# Patient Record
Sex: Female | Born: 1945 | Race: White | Hispanic: No | Marital: Married | Smoking: Never smoker
Health system: Southern US, Community
[De-identification: ages and names within clinical notes are randomized; demographics above are authoritative.]

## PROBLEM LIST (undated history)

## (undated) DIAGNOSIS — C801 Malignant (primary) neoplasm, unspecified: Secondary | ICD-10-CM

## (undated) DIAGNOSIS — I10 Essential (primary) hypertension: Secondary | ICD-10-CM

## (undated) DIAGNOSIS — I639 Cerebral infarction, unspecified: Secondary | ICD-10-CM

## (undated) HISTORY — PX: ABDOMINAL HYSTERECTOMY: SHX81

## (undated) HISTORY — PX: CHOLECYSTECTOMY: SHX55

## (undated) HISTORY — PX: TONSILLECTOMY: SUR1361

---

## 2004-06-21 ENCOUNTER — Ambulatory Visit: Payer: Self-pay | Admitting: Obstetrics and Gynecology

## 2009-07-07 ENCOUNTER — Ambulatory Visit: Payer: Self-pay | Admitting: Cardiology

## 2009-09-22 ENCOUNTER — Ambulatory Visit: Payer: Self-pay | Admitting: Gastroenterology

## 2009-11-01 ENCOUNTER — Ambulatory Visit: Payer: Self-pay | Admitting: Unknown Physician Specialty

## 2010-08-24 ENCOUNTER — Ambulatory Visit: Payer: Self-pay | Admitting: Cardiology

## 2010-11-15 ENCOUNTER — Ambulatory Visit: Payer: Self-pay | Admitting: Unknown Physician Specialty

## 2012-01-05 ENCOUNTER — Ambulatory Visit: Payer: Self-pay | Admitting: Internal Medicine

## 2013-01-17 ENCOUNTER — Ambulatory Visit: Payer: Self-pay | Admitting: Family Medicine

## 2013-12-27 ENCOUNTER — Ambulatory Visit: Payer: Self-pay | Admitting: Family Medicine

## 2013-12-27 LAB — LIPASE, BLOOD: LIPASE: 104 U/L (ref 73–393)

## 2013-12-27 LAB — COMPREHENSIVE METABOLIC PANEL
ALT: 30 U/L (ref 12–78)
ANION GAP: 9 (ref 7–16)
Albumin: 3.9 g/dL (ref 3.4–5.0)
Alkaline Phosphatase: 76 U/L
BILIRUBIN TOTAL: 0.5 mg/dL (ref 0.2–1.0)
BUN: 16 mg/dL (ref 7–18)
CREATININE: 1.1 mg/dL (ref 0.60–1.30)
Calcium, Total: 9.3 mg/dL (ref 8.5–10.1)
Chloride: 101 mmol/L (ref 98–107)
Co2: 30 mmol/L (ref 21–32)
EGFR (African American): >60
GFR CALC NON AF AMER: 52 — AB
GLUCOSE: 109 mg/dL — AB (ref 65–99)
OSMOLALITY: 281 (ref 275–301)
Potassium: 3.8 mmol/L (ref 3.5–5.1)
SGOT(AST): 25 U/L (ref 15–37)
SODIUM: 140 mmol/L (ref 136–145)
Total Protein: 8 g/dL (ref 6.4–8.2)

## 2013-12-27 LAB — CBC WITH DIFFERENTIAL/PLATELET
BASOS ABS: 0 10*3/uL (ref 0.0–0.1)
Basophil %: 0.3 %
EOS ABS: 0 10*3/uL (ref 0.0–0.7)
Eosinophil %: 0.4 %
HCT: 38.6 % (ref 35.0–47.0)
HGB: 12.7 g/dL (ref 12.0–16.0)
Lymphocyte #: 0.7 10*3/uL — ABNORMAL LOW (ref 1.0–3.6)
Lymphocyte %: 19.7 %
MCH: 27.7 pg (ref 26.0–34.0)
MCHC: 32.9 g/dL (ref 32.0–36.0)
MCV: 84 fL (ref 80–100)
Monocyte #: 0.2 x10 3/mm (ref 0.2–0.9)
Monocyte %: 4.8 %
Neutrophil #: 2.6 10*3/uL (ref 1.4–6.5)
Neutrophil %: 74.8 %
Platelet: 162 10*3/uL (ref 150–440)
RBC: 4.6 10*6/uL (ref 3.80–5.20)
RDW: 14.8 % — ABNORMAL HIGH (ref 11.5–14.5)
WBC: 3.4 10*3/uL — ABNORMAL LOW (ref 3.6–11.0)

## 2014-02-26 ENCOUNTER — Ambulatory Visit: Payer: Self-pay | Admitting: Physician Assistant

## 2014-02-27 ENCOUNTER — Ambulatory Visit: Payer: Self-pay | Admitting: Gastroenterology

## 2014-03-05 LAB — PATHOLOGY REPORT

## 2014-10-01 ENCOUNTER — Ambulatory Visit
Admit: 2014-10-01 | Disposition: A | Discharge status: Home / Self Care | Payer: Self-pay | Attending: Family Medicine | Admitting: Family Medicine

## 2014-10-16 ENCOUNTER — Ambulatory Visit: Admit: 2014-10-16 | Disposition: A | Discharge status: Home / Self Care | Payer: Self-pay | Admitting: Family Medicine

## 2014-11-19 ENCOUNTER — Encounter: Payer: Self-pay | Admitting: *Deleted

## 2014-11-19 ENCOUNTER — Encounter
Admission: RE | Disposition: A | Discharge status: Home / Self Care | Payer: Self-pay | Source: Ambulatory Visit | Attending: Gastroenterology

## 2014-11-19 ENCOUNTER — Ambulatory Visit
Admission: RE | Admit: 2014-11-19 | Discharge: 2014-11-19 | Disposition: A | Discharge status: Home / Self Care | Payer: Medicare Other | Source: Ambulatory Visit | Attending: Gastroenterology | Admitting: Gastroenterology

## 2014-11-19 ENCOUNTER — Ambulatory Visit: Payer: Medicare Other | Admitting: Certified Registered Nurse Anesthetist

## 2014-11-19 DIAGNOSIS — Z86010 Personal history of colonic polyps: Secondary | ICD-10-CM | POA: Diagnosis not present

## 2014-11-19 DIAGNOSIS — Z8371 Family history of colonic polyps: Secondary | ICD-10-CM | POA: Diagnosis not present

## 2014-11-19 DIAGNOSIS — Z7982 Long term (current) use of aspirin: Secondary | ICD-10-CM | POA: Diagnosis not present

## 2014-11-19 DIAGNOSIS — Z7951 Long term (current) use of inhaled steroids: Secondary | ICD-10-CM | POA: Diagnosis not present

## 2014-11-19 DIAGNOSIS — Z1211 Encounter for screening for malignant neoplasm of colon: Secondary | ICD-10-CM | POA: Insufficient documentation

## 2014-11-19 HISTORY — DX: Essential (primary) hypertension: I10

## 2014-11-19 HISTORY — DX: Cerebral infarction, unspecified: I63.9

## 2014-11-19 HISTORY — PX: COLONOSCOPY: SHX5424

## 2014-11-19 SURGERY — COLONOSCOPY
Anesthesia: General

## 2014-11-19 MED ORDER — SODIUM CHLORIDE 0.9 % IV SOLN
INTRAVENOUS | Status: DC
  Administered 2014-11-19: 1000 mL via INTRAVENOUS

## 2014-11-19 MED ORDER — LIDOCAINE HCL (CARDIAC) 20 MG/ML IV SOLN
INTRAVENOUS | Status: DC | PRN
  Administered 2014-11-19: 20 mg via INTRAVENOUS

## 2014-11-19 MED ORDER — PROPOFOL INFUSION 10 MG/ML OPTIME
INTRAVENOUS | Status: DC | PRN
  Administered 2014-11-19: 160 ug/kg/min via INTRAVENOUS

## 2014-11-19 MED ORDER — PROPOFOL 10 MG/ML IV BOLUS
INTRAVENOUS | Status: DC | PRN
  Administered 2014-11-19: 50 mg via INTRAVENOUS

## 2014-11-19 NOTE — Anesthesia Postprocedure Evaluation (Signed)
  Anesthesia Post-op Note  Patient: Julie Werner  Procedure(s) Performed: Procedure(s): COLONOSCOPY (N/A)  Anesthesia type:General  Patient location: PACU  Post pain: Pain level controlled  Post assessment: Post-op Vital signs reviewed, Patient's Cardiovascular Status Stable, Respiratory Function Stable, Patent Airway and No signs of Nausea or vomiting  Post vital signs: Reviewed and stable  Last Vitals:  Filed Vitals:   11/19/14 0940  BP: 114/65  Pulse: 67  Temp:   Resp: 25    Level of consciousness: awake, alert  and patient cooperative  Complications: No apparent anesthesia complications

## 2014-11-19 NOTE — Transfer of Care (Signed)
Immediate Anesthesia Transfer of Care Note  Patient: Julie Werner  Procedure(s) Performed: Procedure(s): COLONOSCOPY (N/A)  Patient Location: Endoscopy Unit  Anesthesia Type:General  Level of Consciousness: awake, alert  and oriented  Airway & Oxygen Therapy: Patient Spontanous Breathing and Patient connected to nasal cannula oxygen  Post-op Assessment: Report given to RN and Post -op Vital signs reviewed and stable  Post vital signs: Reviewed and stable  Last Vitals:  Filed Vitals:   11/19/14 0921  BP: 110/65  Pulse: 77  Temp: 35.7 C  Resp: 16    Complications: No apparent anesthesia complications

## 2014-11-19 NOTE — Anesthesia Preprocedure Evaluation (Addendum)
Anesthesia Evaluation  Patient identified by MRN, date of birth, ID band Patient awake    Reviewed: Allergy & Precautions, NPO status , Patient's Chart, lab work & pertinent test results, reviewed documented beta blocker date and time   Airway Mallampati: II  TM Distance: >3 FB Neck ROM: Full    Dental no notable dental hx.    Pulmonary neg pulmonary ROS,  breath sounds clear to auscultation  Pulmonary exam normal       Cardiovascular hypertension, Pt. on medications and Pt. on home beta blockers + CAD Normal cardiovascular examRhythm:Regular Rate:Normal     Neuro/Psych TIAnegative psych ROS   GI/Hepatic Neg liver ROS, GERD-  Medicated and Controlled,  Endo/Other  negative endocrine ROS  Renal/GU negative Renal ROS  negative genitourinary   Musculoskeletal negative musculoskeletal ROS (+)   Abdominal   Peds negative pediatric ROS (+)  Hematology negative hematology ROS (+)   Anesthesia Other Findings   Reproductive/Obstetrics negative OB ROS                            Anesthesia Physical Anesthesia Plan  ASA: III  Anesthesia Plan: General   Post-op Pain Management:    Induction: Intravenous  Airway Management Planned: Nasal Cannula  Additional Equipment:   Intra-op Plan:   Post-operative Plan:   Informed Consent: I have reviewed the patients History and Physical, chart, labs and discussed the procedure including the risks, benefits and alternatives for the proposed anesthesia with the patient or authorized representative who has indicated his/her understanding and acceptance.     Plan Discussed with: CRNA and Surgeon  Anesthesia Plan Comments:         Anesthesia Quick Evaluation

## 2014-11-19 NOTE — H&P (Signed)
    Primary Care Physician:  Sharyne Peach, MD Primary Gastroenterologist:  Dr. Candace Cruise  Pre-Procedure History & Physical: HPI:  Julie Werner is a 69 y.o. female is here for a colonoscopy.   No past medical history on file.  No past surgical history on file.  Prior to Admission medications   Medication Sig Start Date End Date Taking? Authorizing Provider  aspirin EC 81 MG tablet Take 81 mg by mouth daily.   Yes Historical Provider, MD  calcium carbonate (OS-CAL) 600 MG TABS tablet Take 600 mg by mouth 2 (two) times daily with a meal.   Yes Historical Provider, MD  docusate sodium (COLACE) 100 MG capsule Take 100 mg by mouth 2 (two) times daily.   Yes Historical Provider, MD  fluticasone (FLONASE) 50 MCG/ACT nasal spray Place 1 spray into both nostrils daily.   Yes Historical Provider, MD  metoprolol succinate (TOPROL-XL) 50 MG 24 hr tablet Take 50 mg by mouth daily. Take with or immediately following a meal.   Yes Historical Provider, MD  omeprazole (PRILOSEC) 20 MG capsule Take 20 mg by mouth daily.   Yes Historical Provider, MD  simvastatin (ZOCOR) 40 MG tablet Take 40 mg by mouth daily.   Yes Historical Provider, MD  triamterene-hydrochlorothiazide (DYAZIDE) 37.5-25 MG per capsule Take 1 capsule by mouth daily.   Yes Historical Provider, MD  vitamin C (ASCORBIC ACID) 500 MG tablet Take 500 mg by mouth daily.   Yes Historical Provider, MD    Allergies as of 10/07/2014  . (Not on File)    No family history on file.  History   Social History  . Marital Status: Married    Spouse Name: N/A  . Number of Children: N/A  . Years of Education: N/A   Occupational History  . Not on file.   Social History Main Topics  . Smoking status: Not on file  . Smokeless tobacco: Not on file  . Alcohol Use: Not on file  . Drug Use: Not on file  . Sexual Activity: Not on file   Other Topics Concern  . Not on file   Social History Narrative  . No narrative on file    Review of  Systems: See HPI, otherwise negative ROS  Physical Exam: There were no vitals taken for this visit. General:   Alert,  pleasant and cooperative in NAD Head:  Normocephalic and atraumatic. Neck:  Supple; no masses or thyromegaly. Lungs:  Clear throughout to auscultation.    Heart:  Regular rate and rhythm. Abdomen:  Soft, nontender and nondistended. Normal bowel sounds, without guarding, and without rebound.   Neurologic:  Alert and  oriented x4;  grossly normal neurologically.  Impression/Plan: Julie Werner is here for a colonoscopyto be performed for personal hx of colon polyps and family hx of colon polyps.  Risks, benefits, limitations, and alternatives regarding colonoscopy have been reviewed with the patient.  Questions have been answered.  All parties agreeable.   Blayne Garlick, Lupita Dawn, MD  11/19/2014, 8:29 AM

## 2014-11-19 NOTE — Op Note (Signed)
[facility] Gastroenterology Patient Name: Julie Werner Procedure Date: 11/19/2014 8:57 AM MRN: [id number] Account #: [id number] Date of Birth: Jul 03, 1945 Admit Type: Outpatient Age: 69 Room: [facility] ENDO ROOM 4 Gender: Female Note Status: Finalized Procedure:         Colonoscopy Indications:       Family history of colonic polyps in a first-degree                     relative, Personal history of colonic polyps Providers:         Lupita Dawn. Candace Cruise, MD Referring MD:      Rubbie Battiest Iona Beard, MD (Referring MD) Medicines:         Monitored Anesthesia Care Complications:     No immediate complications. Procedure:         Pre-Anesthesia Assessment:                    - Prior to the procedure, a History and Physical was                     performed, and patient medications, allergies and                     sensitivities were reviewed. The patient's tolerance of                     previous anesthesia was reviewed.                    - The risks and benefits of the procedure and the sedation                     options and risks were discussed with the patient. All                     questions were answered and informed consent was obtained.                    - After reviewing the risks and benefits, the patient was                     deemed in satisfactory condition to undergo the procedure.                    After obtaining informed consent, the colonoscope was                     passed under direct vision. Throughout the procedure, the                     patient's blood pressure, pulse, and oxygen saturations                     were monitored continuously. The Colonoscope was                     introduced through the anus and advanced to the the cecum,                     identified by appendiceal orifice and ileocecal valve. The                     colonoscopy was performed without difficulty. The patient  tolerated the procedure well. The quality of  the bowel                     preparation was good. Findings:      The colon (entire examined portion) appeared normal. Impression:        - The entire examined colon is normal.                    - No specimens collected. Recommendation:    - Discharge patient to home.                    - Repeat colonoscopy in 5 years for surveillance.                    - The findings and recommendations were discussed with the                     patient. Procedure Code(s): --- Professional ---                    [phone], Colonoscopy, flexible; diagnostic, including                     collection of specimen(s) by brushing or washing, when                     performed (separate procedure) Diagnosis Code(s): --- Professional ---                    Z83.71, Family history of colonic polyps                    Z86.010, Personal history of colonic polyps CPT copyright 2014 [employer]. All rights reserved. The codes documented in this report are preliminary and upon coder review may  be revised to meet current compliance requirements. Hulen Luster, MD 11/19/2014 9:18:04 AM This report has been signed electronically. Number of Addenda: 0 Note Initiated On: 11/19/2014 8:57 AM Scope Withdrawal Time: 0 hours 5 minutes 36 seconds  Total Procedure Duration: 0 hours 14 minutes 11 seconds       [facility]

## 2014-11-20 ENCOUNTER — Encounter: Payer: Self-pay | Admitting: Gastroenterology

## 2014-11-20 NOTE — Progress Notes (Signed)
Patient has had a fever since returning home after eating.  Yesterday it was 103.  Last night 101.  Has not checked it this morning.  Encouragedto check temp this morning and to call physician if it is still elevated.

## 2016-06-29 IMAGING — MG MM DIGITAL SCREENING BILAT W/ CAD
5 series · 8 of 8 positions shown · non-contrast
Comparison: Previous exam(s).

CLINICAL DATA: Screening.

EXAM:
DIGITAL SCREENING BILATERAL MAMMOGRAM WITH CAD

[R CC · right · 4 of 4 slices shown (1 of 2)]
[im 1/4]
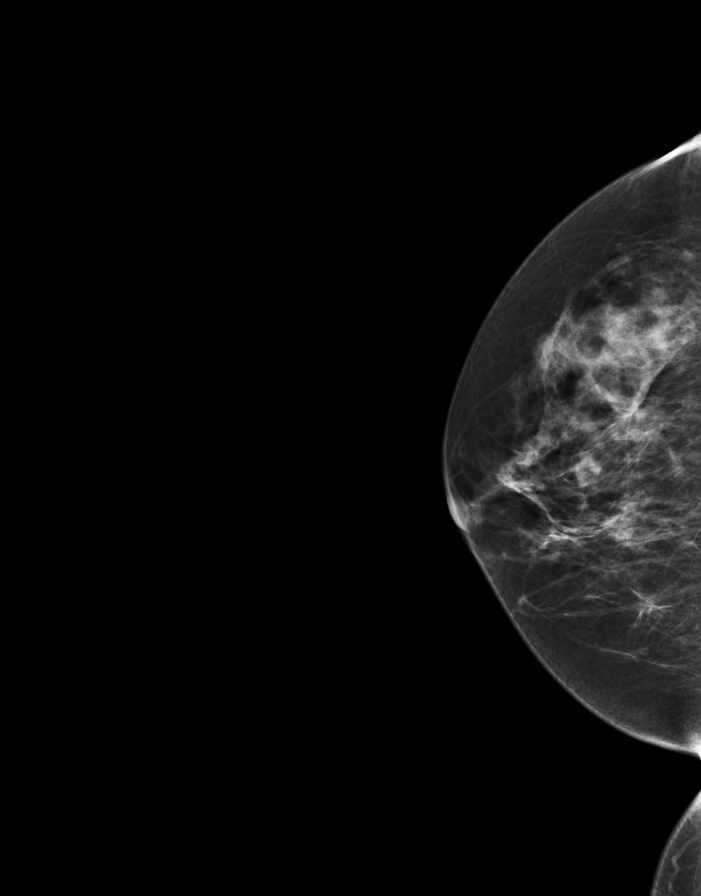
[im 2/4]
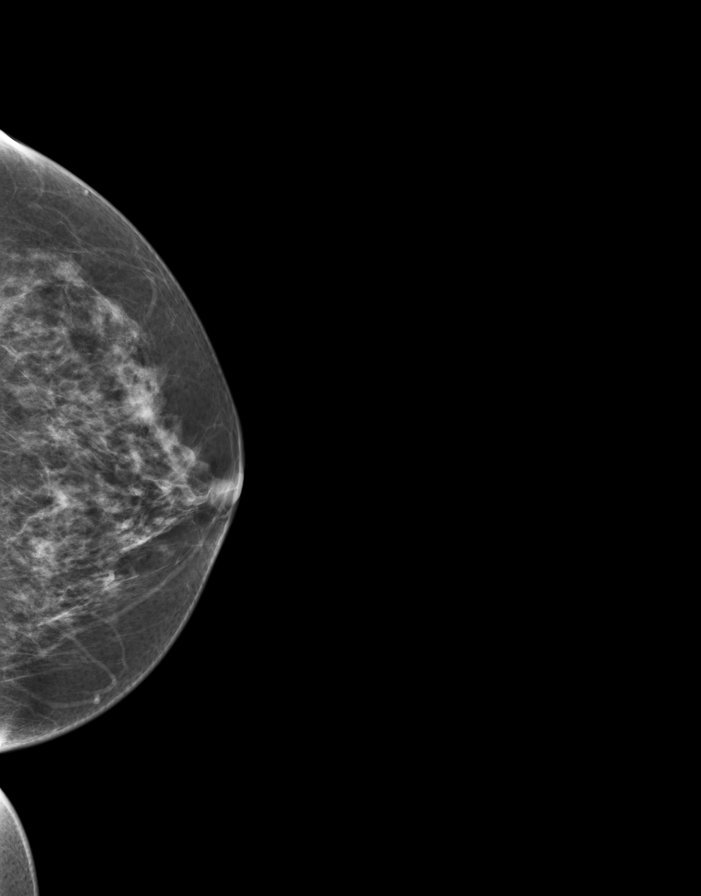
[im 3/4]
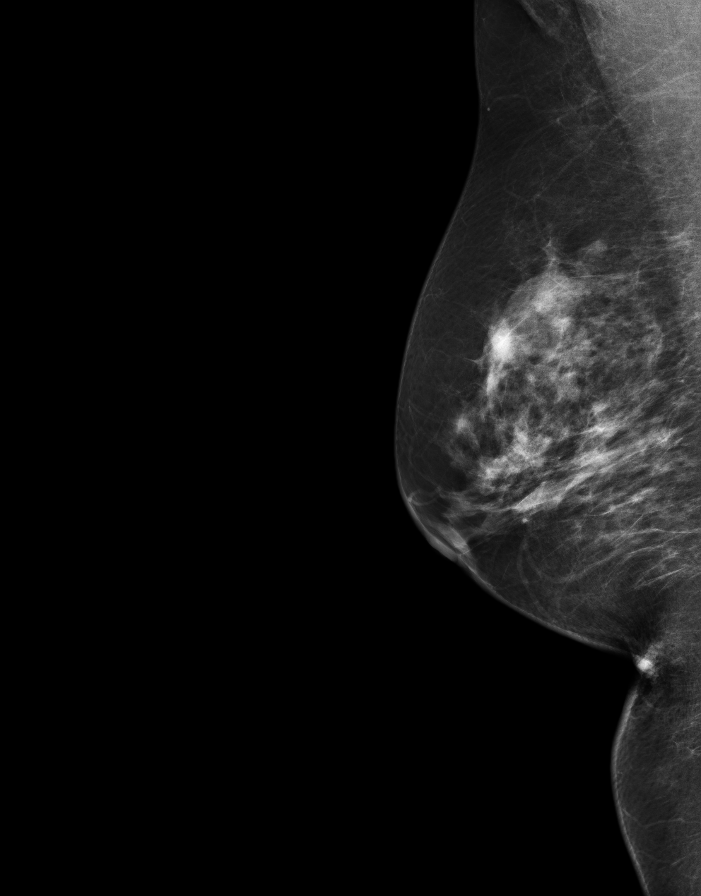
[im 4/4]
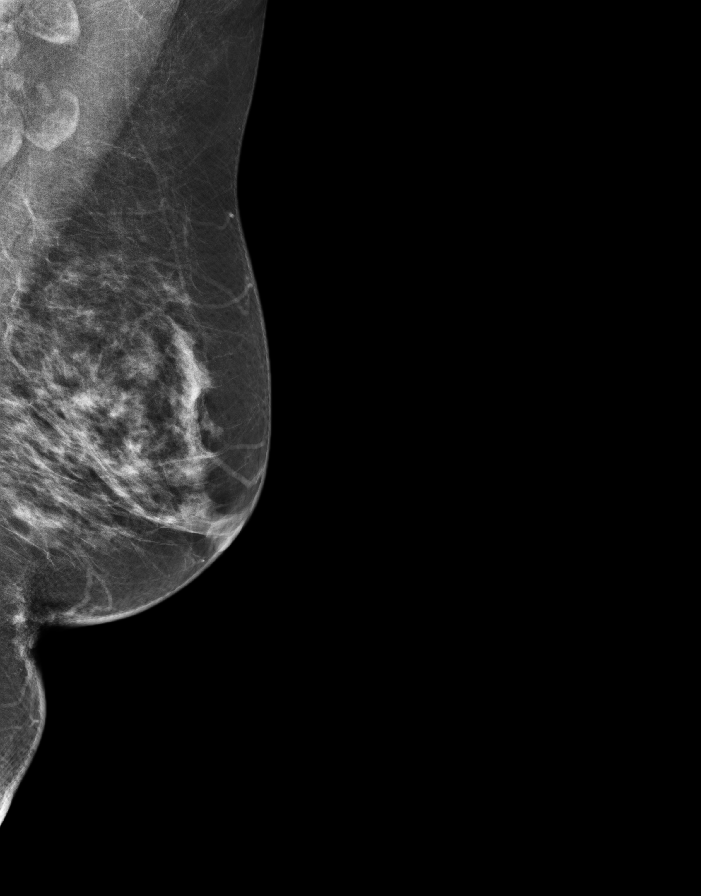

[L MLO]
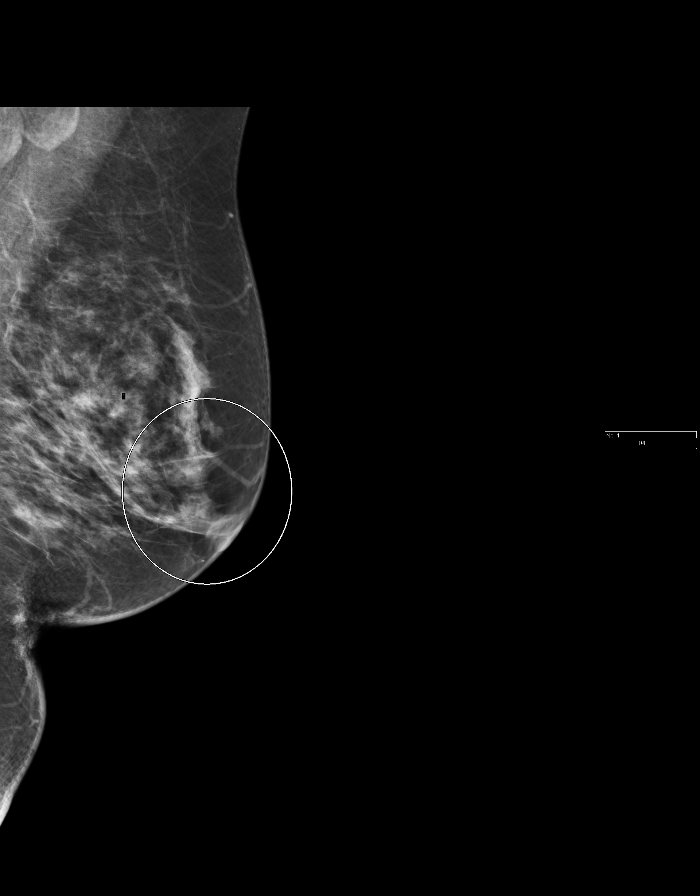

[R CC (2 of 2)]
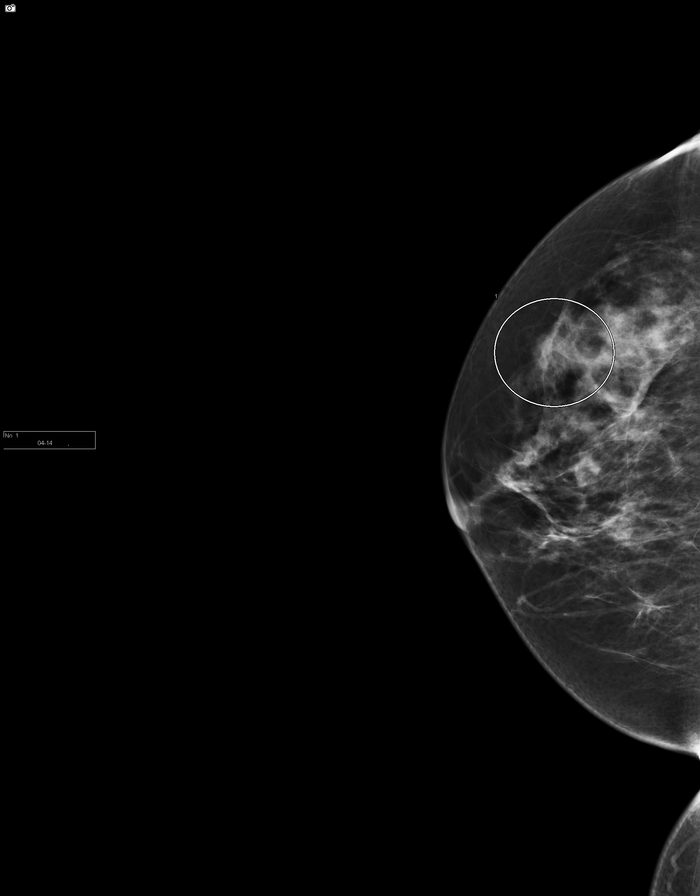

[L CC]
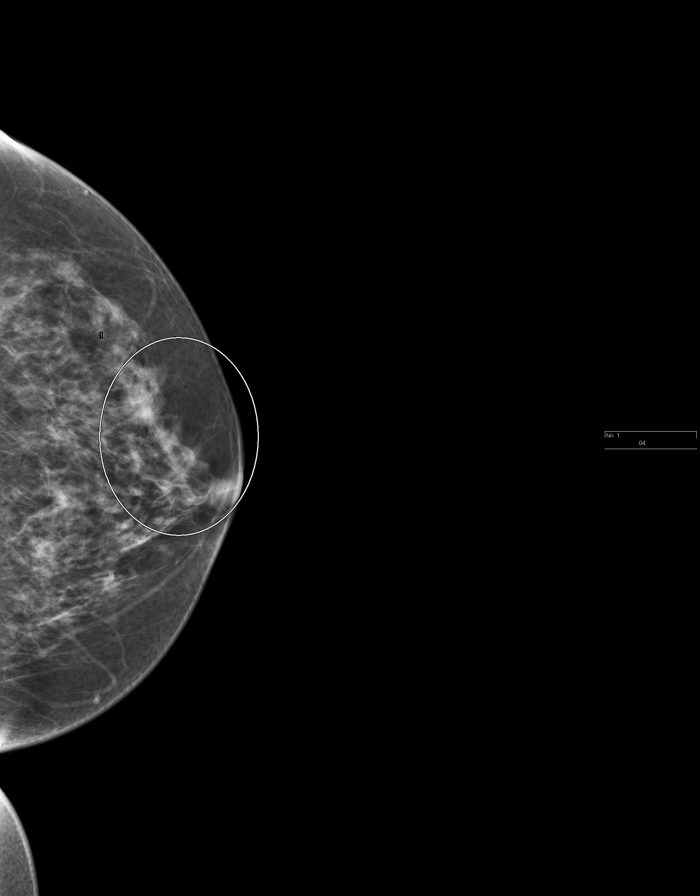

[R MLO]
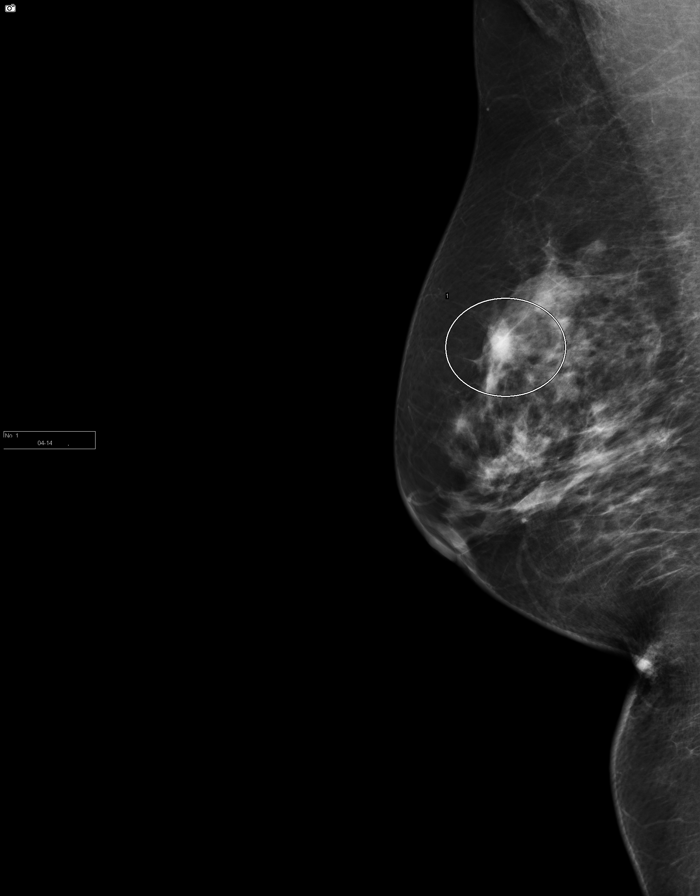

[8 of 8 positions shown; findings below may reference images not displayed]

ACR Breast Density Category c: The breast tissue is heterogeneously
dense, which may obscure small masses.
FINDINGS: Bilateral breast asymmetries require further evaluation with
tomosynthesis and possible ultrasound

Images were processed with CAD.
IMPRESSION: Further evaluation is suggested for possible asymmetry in the right
breast.

Further evaluation is suggested for possible asymmetry in the left
breast.

RECOMMENDATION:
Diagnostic mammogram and possibly ultrasound of both breasts.
(Code:[id number]-[id number])

The patient will be contacted regarding the findings, and additional
imaging will be scheduled.

BI-RADS CATEGORY  0: Incomplete. Need additional imaging evaluation
and/or prior mammograms for comparison.

## 2016-06-30 ENCOUNTER — Other Ambulatory Visit: Payer: Self-pay | Admitting: Family Medicine

## 2016-06-30 DIAGNOSIS — Z1231 Encounter for screening mammogram for malignant neoplasm of breast: Secondary | ICD-10-CM

## 2016-07-07 ENCOUNTER — Ambulatory Visit
Admission: EM | Admit: 2016-07-07 | Discharge: 2016-07-07 | Disposition: A | Discharge status: Home / Self Care | Payer: Medicare Other | Attending: Family Medicine | Admitting: Family Medicine

## 2016-07-07 ENCOUNTER — Encounter: Payer: Self-pay | Admitting: Emergency Medicine

## 2016-07-07 DIAGNOSIS — Z9049 Acquired absence of other specified parts of digestive tract: Secondary | ICD-10-CM | POA: Diagnosis not present

## 2016-07-07 DIAGNOSIS — M791 Myalgia: Secondary | ICD-10-CM | POA: Insufficient documentation

## 2016-07-07 DIAGNOSIS — I10 Essential (primary) hypertension: Secondary | ICD-10-CM | POA: Diagnosis not present

## 2016-07-07 DIAGNOSIS — Z7982 Long term (current) use of aspirin: Secondary | ICD-10-CM | POA: Insufficient documentation

## 2016-07-07 DIAGNOSIS — Z880 Allergy status to penicillin: Secondary | ICD-10-CM | POA: Insufficient documentation

## 2016-07-07 DIAGNOSIS — J111 Influenza due to unidentified influenza virus with other respiratory manifestations: Secondary | ICD-10-CM

## 2016-07-07 DIAGNOSIS — J029 Acute pharyngitis, unspecified: Secondary | ICD-10-CM | POA: Diagnosis present

## 2016-07-07 DIAGNOSIS — R69 Illness, unspecified: Secondary | ICD-10-CM

## 2016-07-07 DIAGNOSIS — R05 Cough: Secondary | ICD-10-CM | POA: Diagnosis present

## 2016-07-07 DIAGNOSIS — Z8673 Personal history of transient ischemic attack (TIA), and cerebral infarction without residual deficits: Secondary | ICD-10-CM | POA: Insufficient documentation

## 2016-07-07 LAB — RAPID INFLUENZA A&B ANTIGENS
Influenza A (ARMC): NEGATIVE
Influenza B (ARMC): NEGATIVE

## 2016-07-07 LAB — RAPID STREP SCREEN (MED CTR MEBANE ONLY): Streptococcus, Group A Screen (Direct): NEGATIVE

## 2016-07-07 MED ORDER — MELOXICAM 7.5 MG PO TABS: 7.5000 mg | ORAL_TABLET | Freq: Two times a day (BID) | ORAL | 0 refills | Status: AC | PRN

## 2016-07-07 MED ORDER — BENZONATATE 200 MG PO CAPS: 200.0000 mg | ORAL_CAPSULE | Freq: Three times a day (TID) | ORAL | 0 refills | Status: AC | PRN

## 2016-07-07 MED ORDER — FEXOFENADINE-PSEUDOEPHED ER 180-240 MG PO TB24: 1.0000 | ORAL_TABLET | Freq: Every day | ORAL | 0 refills | Status: AC

## 2016-07-07 NOTE — ED Triage Notes (Signed)
Patient c/o low grade fever, sore throat, and cough since Tuesday.

## 2016-07-07 NOTE — ED Provider Notes (Addendum)
[facility]    CSN: [id number] Arrival date & time: 07/07/16  1101     History   Chief Complaint Chief Complaint  Patient presents with  . Sore Throat  . Cough    HPI Julie Werner is a 71 y.o. female.   She reports feeling sick or getting sick on Tuesday morning. She states her husband went to the [place] Saturday was diagnosed him the flu she's having the same symptoms that he is having. He was placed on Tamiflu and feels much better now. She was planning to see her PCP on Wednesday but because of the snow they've not been open since Tuesday. She reports coughing nonproductive sinus congestion and fever as late as last night up to 100.3 chills myalgia and sore throat. She does report feeling a little bit better this morning. She's not well. She does not smoke no known drug allergies. No pertinent family medical history other than exposure by her husband who had the flu who did receive a flu shot. She will be noted that she did not receive a flu shot this year. She's had abdominal hysterectomy cholecystectomy colonoscopy tonsillectomy and has had stroke before in the past.   The history is provided by the patient. No language interpreter was used.  Sore Throat  This is a new problem. The current episode started more than 2 days ago. The problem occurs constantly. The problem has been gradually improving. Pertinent negatives include no chest pain, no abdominal pain, no headaches and no shortness of breath. Nothing aggravates the symptoms. Nothing relieves the symptoms. She has tried nothing for the symptoms.  Cough  Associated symptoms: sore throat   Associated symptoms: no chest pain, no headaches and no shortness of breath     Past Medical History:  Diagnosis Date  . Hypertension   . Stroke [facility])     There are no active problems to display for this patient.   Past Surgical History:  Procedure Laterality Date  . ABDOMINAL HYSTERECTOMY    . CHOLECYSTECTOMY    .  COLONOSCOPY N/A 11/19/2014   Procedure: COLONOSCOPY;  Surgeon: Hulen Luster, MD;  Location: [facility] ENDOSCOPY;  Service: Gastroenterology;  Laterality: N/A;  . TONSILLECTOMY      OB History    No data available       Home Medications    Prior to Admission medications   Medication Sig Start Date End Date Taking? Authorizing Provider  Apremilast 30 MG TABS Take by mouth.    Historical Provider, MD  aspirin EC 81 MG tablet Take 81 mg by mouth daily.    Historical Provider, MD  benzonatate (TESSALON) 200 MG capsule Take 1 capsule (200 mg total) by mouth 3 (three) times daily as needed. 07/07/16   Frederich Cha, MD  calcium carbonate (OS-CAL) 600 MG TABS tablet Take 600 mg by mouth 2 (two) times daily with a meal.    Historical Provider, MD  docusate sodium (COLACE) 100 MG capsule Take 100 mg by mouth 2 (two) times daily.    Historical Provider, MD  fexofenadine-pseudoephedrine (ALLEGRA-D ALLERGY & CONGESTION) 180-240 MG 24 hr tablet Take 1 tablet by mouth daily. 07/07/16   Frederich Cha, MD  fluticasone (FLONASE) 50 MCG/ACT nasal spray Place 1 spray into both nostrils daily.    Historical Provider, MD  meloxicam (MOBIC) 7.5 MG tablet Take 1 tablet (7.5 mg total) by mouth 2 (two) times daily as needed for pain. 07/07/16   Frederich Cha, MD  metoprolol succinate (  TOPROL-XL) 50 MG 24 hr tablet Take 50 mg by mouth daily. Take with or immediately following a meal.    Historical Provider, MD  omeprazole (PRILOSEC) 20 MG capsule Take 20 mg by mouth daily.    Historical Provider, MD  simvastatin (ZOCOR) 40 MG tablet Take 40 mg by mouth daily.    Historical Provider, MD  triamterene-hydrochlorothiazide (DYAZIDE) 37.5-25 MG per capsule Take 1 capsule by mouth daily.    Historical Provider, MD  vitamin C (ASCORBIC ACID) 500 MG tablet Take 500 mg by mouth daily.    Historical Provider, MD    Family History History reviewed. No pertinent family history.  Social History Social History  Substance Use Topics  .  Smoking status: Never Smoker  . Smokeless tobacco: Never Used  . Alcohol use No     Allergies   Macrodantin [nitrofurantoin macrocrystal]; Norco [hydrocodone-acetaminophen]; and Penicillins   Review of Systems Review of Systems  HENT: Positive for congestion and sore throat.   Respiratory: Positive for cough. Negative for shortness of breath.   Cardiovascular: Negative for chest pain.  Gastrointestinal: Negative for abdominal pain.  Neurological: Negative for headaches.  All other systems reviewed and are negative.    Physical Exam Triage Vital Signs ED Triage Vitals  Enc Vitals Group     BP 07/07/16 1250 131/73     Pulse Rate 07/07/16 1250 81     Resp 07/07/16 1250 16     Temp 07/07/16 1250 99.2 F (37.3 C)     Temp Source 07/07/16 1250 Oral     SpO2 07/07/16 1250 100 %     Weight 07/07/16 1247 156 lb (70.8 kg)     Height 07/07/16 1247 5\' 2"  (1.575 m)     Head Circumference --      Peak Flow --      Pain Score 07/07/16 1250 6     Pain Loc --      Pain Edu? --      Excl. in [place]? --    No data found.   Updated Vital Signs BP 131/73 (BP Location: Right Arm)   Pulse 81   Temp 99.2 F (37.3 C) (Oral)   Resp 16   Ht 5\' 2"  (1.575 m)   Wt 156 lb (70.8 kg)   SpO2 100%   BMI 28.53 kg/m   Visual Acuity Right Eye Distance:   Left Eye Distance:   Bilateral Distance:    Right Eye Near:   Left Eye Near:    Bilateral Near:     Physical Exam  Constitutional: She is oriented to person, place, and time. She appears well-developed and well-nourished.  HENT:  Head: Normocephalic and atraumatic.  Right Ear: External ear normal.  Left Ear: External ear normal.  Nose: Nose normal.  Mouth/Throat: Oropharynx is clear and moist.  Eyes: Pupils are equal, round, and reactive to light.  Neck: Normal range of motion. Neck supple. No tracheal deviation present.  Cardiovascular: Normal rate and regular rhythm.   Pulmonary/Chest: Effort normal and breath sounds normal.    Musculoskeletal: Normal range of motion. She exhibits no edema or tenderness.  Lymphadenopathy:    She has no cervical adenopathy.  Neurological: She is alert and oriented to person, place, and time. No cranial nerve deficit.  Skin: Skin is warm.  Psychiatric: She has a normal mood and affect.  Vitals reviewed.    UC Treatments / Results  Labs (all labs ordered are listed, but only abnormal results are displayed) Labs  Reviewed  RAPID INFLUENZA A&B ANTIGENS ([facility] ONLY)  RAPID STREP SCREEN (NOT AT [facility])  CULTURE, GROUP A STREP [facility])    EKG  EKG Interpretation None       Radiology No results found.  Procedures Procedures (including critical care time)  Medications Ordered in UC Medications - No data to display   Initial Impression / Assessment and Plan / UC Course  I have reviewed the triage vital signs and the nursing notes.  Pertinent labs & imaging results that were available during my care of the patient were reviewed by me and considered in my medical decision making (see chart for details).   patient will be placed on Mobic 7.5 one tablet once twice a day and Tessalon Perles for cough since she doesn't tolerate hydrocodone Allegra-D 1 tablet daily. She declined Tamiflu but I asked her to call Sunday and not better may have to call in a Z-Pak if she is not improving because secondary infection.    Final Clinical Impressions(s) / UC Diagnoses   Final diagnoses:  Influenza-like illness  Acute pharyngitis, unspecified etiology    New Prescriptions New Prescriptions   BENZONATATE (TESSALON) 200 MG CAPSULE    Take 1 capsule (200 mg total) by mouth 3 (three) times daily as needed.   FEXOFENADINE-PSEUDOEPHEDRINE (ALLEGRA-D ALLERGY & CONGESTION) 180-240 MG 24 HR TABLET    Take 1 tablet by mouth daily.   MELOXICAM (MOBIC) 7.5 MG TABLET    Take 1 tablet (7.5 mg total) by mouth 2 (two) times daily as needed for pain.    Note: This dictation was prepared with  Dragon dictation along with smaller phrase technology. Any transcriptional errors that result from this process are unintentional.   Frederich Cha, MD 07/07/16 [place], MD 07/07/16 [phone]

## 2016-07-10 ENCOUNTER — Ambulatory Visit: Admission: RE | Admit: 2016-07-10 | Payer: Medicare Other | Source: Ambulatory Visit

## 2016-07-10 LAB — CULTURE, GROUP A STREP (THRC)

## 2016-08-21 ENCOUNTER — Ambulatory Visit
Admission: RE | Admit: 2016-08-21 | Discharge: 2016-08-21 | Disposition: A | Discharge status: Home / Self Care | Payer: Medicare Other | Source: Ambulatory Visit | Attending: Family Medicine | Admitting: Family Medicine

## 2016-08-21 DIAGNOSIS — Z1231 Encounter for screening mammogram for malignant neoplasm of breast: Secondary | ICD-10-CM | POA: Diagnosis present

## 2016-08-21 HISTORY — DX: Malignant (primary) neoplasm, unspecified: C80.1

## 2017-01-04 ENCOUNTER — Ambulatory Visit
Admission: EM | Admit: 2017-01-04 | Discharge: 2017-01-04 | Disposition: A | Discharge status: Home / Self Care | Payer: Medicare Other | Attending: Family Medicine | Admitting: Family Medicine

## 2017-01-04 DIAGNOSIS — S80861A Insect bite (nonvenomous), right lower leg, initial encounter: Secondary | ICD-10-CM

## 2017-01-04 DIAGNOSIS — L989 Disorder of the skin and subcutaneous tissue, unspecified: Secondary | ICD-10-CM

## 2017-01-04 MED ORDER — MUPIROCIN 2 % EX OINT: 1.0000 "application " | TOPICAL_OINTMENT | Freq: Three times a day (TID) | CUTANEOUS | 0 refills | Status: AC

## 2017-01-04 NOTE — ED Triage Notes (Signed)
Pt reports "insect bite" to right shin that occurred about 6-8 weeks ago. PT reports she assumed it was a mosquito bite d/t red bump and itching for first couple days present, but it never went away. Denies and pain or itching to area at this time.

## 2017-01-04 NOTE — ED Provider Notes (Signed)
CSN: [id number]     Arrival date & time 01/04/17  1403 History   First MD Initiated Contact with Patient 01/04/17 1517     Chief Complaint  Patient presents with  . Insect Bite   (Consider location/radiation/quality/duration/timing/severity/associated sxs/prior Treatment) HPI  This is a 71 year old female who presents with a "insect bite" to her right shin that occurred about 6-8 weeks ago. She assumed it was a mosquito bite that was red bump at first and itched for the first couple days but it never subsided.  Denies any pain or itching currently. She has been using Neosporin on the area. She has noticed a small satellite lesion occurring just proximal to the first but smaller in size. The area is very rough and scaly but there is no induration or fluctuance. Is not draining. There is no pain. Refer to photographs for detail.        Past Medical History:  Diagnosis Date  . Cancer ([place])    skin ca  . Hypertension   . Stroke [facility])    Past Surgical History:  Procedure Laterality Date  . ABDOMINAL HYSTERECTOMY    . CHOLECYSTECTOMY    . COLONOSCOPY N/A 11/19/2014   Procedure: COLONOSCOPY;  Surgeon: Hulen Luster, MD;  Location: [facility] ENDOSCOPY;  Service: Gastroenterology;  Laterality: N/A;  . TONSILLECTOMY     Family History  Problem Relation Age of Onset  . Breast cancer Paternal Grandmother 25   Social History  Substance Use Topics  . Smoking status: Never Smoker  . Smokeless tobacco: Never Used  . Alcohol use No   OB History    No data available     Review of Systems  Constitutional: Negative for activity change, appetite change, chills, fatigue and fever.  Skin: Positive for color change and rash.  All other systems reviewed and are negative.   Allergies  Macrodantin [nitrofurantoin macrocrystal]; Norco [hydrocodone-acetaminophen]; and Penicillins  Home Medications   Prior to Admission medications   Medication Sig Start Date End Date Taking? Authorizing Provider    aspirin EC 81 MG tablet Take 81 mg by mouth daily.   Yes [provider]  calcium carbonate (OS-CAL) 600 MG TABS tablet Take 600 mg by mouth 2 (two) times daily with a meal.   Yes [provider]  docusate sodium (COLACE) 100 MG capsule Take 100 mg by mouth 2 (two) times daily.   Yes [provider]  metoprolol succinate (TOPROL-XL) 50 MG 24 hr tablet Take 50 mg by mouth daily. Take with or immediately following a meal.   Yes [provider]  omeprazole (PRILOSEC) 20 MG capsule Take 20 mg by mouth daily.   Yes [provider]  simvastatin (ZOCOR) 40 MG tablet Take 40 mg by mouth daily.   Yes [provider]  triamterene-hydrochlorothiazide (DYAZIDE) 37.5-25 MG per capsule Take 1 capsule by mouth daily.   Yes [provider]  vitamin C (ASCORBIC ACID) 500 MG tablet Take 500 mg by mouth daily.   Yes [provider]  Apremilast 30 MG TABS Take by mouth.    [provider]  benzonatate (TESSALON) 200 MG capsule Take 1 capsule (200 mg total) by mouth 3 (three) times daily as needed. 07/07/16   Frederich Cha, MD  fexofenadine-pseudoephedrine (ALLEGRA-D ALLERGY & CONGESTION) 180-240 MG 24 hr tablet Take 1 tablet by mouth daily. 07/07/16   Frederich Cha, MD  fluticasone (FLONASE) 50 MCG/ACT nasal spray Place 1 spray into both nostrils daily.    [provider]  meloxicam (MOBIC) 7.5 MG tablet Take 1 tablet (7.5 mg total) by mouth 2 (two) times daily as needed for pain. 07/07/16   Frederich Cha, MD  mupirocin ointment (BACTROBAN) 2 % Apply 1 application topically 3 (three) times daily. 01/04/17   Lorin Picket, PA-C   Meds Ordered and Administered this Visit  Medications - No data to display  BP 133/64 (BP Location: Left Arm)   Pulse 63   Temp 98.8 F (37.1 C) (Oral)   Resp 16   Ht 5\' 2"  (1.575 m)   Wt 163 lb 9.3 oz (74.2 kg)   SpO2 99%   BMI 29.92 kg/m  No data found.   Physical Exam  Constitutional: She  is oriented to person, place, and time. She appears well-developed and well-nourished. No distress.  HENT:  Head: Normocephalic.  Eyes: Pupils are equal, round, and reactive to light.  Neck: Normal range of motion.  Musculoskeletal: Normal range of motion.  Neurological: She is alert and oriented to person, place, and time.  Skin: Skin is warm and dry. She is not diaphoretic.  Refer to photos for detail  Psychiatric: She has a normal mood and affect. Her behavior is normal. Judgment and thought content normal.  Nursing note and vitals reviewed.       Urgent Care Course     Procedures (including critical care time)  Labs Review Labs Reviewed - No data to display  Imaging Review No results found.   Visual Acuity Review  Right Eye Distance:   Left Eye Distance:   Bilateral Distance:    Right Eye Near:   Left Eye Near:    Bilateral Near:         MDM   1. Skin lesion of right lower limb    Discharge Medication List as of 01/04/2017  3:34 PM    Plan: 1. Test/x-ray results and diagnosis reviewed with patient 2. rx as per orders; risks, benefits, potential side effects reviewed with patient 3. Recommend supportive treatment with Bactroban ointment to the lesions 3 times daily for possible impetigo. Because of her recent use of Neosporin she may have irritated the area. Is also possible this could represent a skin cancer and so I have recommended that she follow-up with a dermatologist. She has seen Dr. Aubery Lapping in the past. She will arrange an Appointment to see her. 4. F/u prn if symptoms worsen or don't improve     Lorin Picket, PA-C 01/04/17 1606

## 2018-01-02 ENCOUNTER — Other Ambulatory Visit: Payer: Self-pay | Admitting: Family Medicine

## 2018-01-02 DIAGNOSIS — Z1231 Encounter for screening mammogram for malignant neoplasm of breast: Secondary | ICD-10-CM

## 2018-01-10 ENCOUNTER — Ambulatory Visit
Admission: RE | Admit: 2018-01-10 | Discharge: 2018-01-10 | Disposition: A | Discharge status: Home / Self Care | Payer: Medicare Other | Source: Ambulatory Visit | Attending: Family Medicine | Admitting: Family Medicine

## 2018-01-10 DIAGNOSIS — Z1231 Encounter for screening mammogram for malignant neoplasm of breast: Secondary | ICD-10-CM

## 2019-03-05 ENCOUNTER — Other Ambulatory Visit: Payer: Self-pay | Admitting: Family Medicine

## 2019-03-05 DIAGNOSIS — Z780 Asymptomatic menopausal state: Secondary | ICD-10-CM

## 2020-04-24 LAB — COLOGUARD: COLOGUARD: NEGATIVE

## 2020-04-24 LAB — EXTERNAL GENERIC LAB PROCEDURE: COLOGUARD: NEGATIVE

## 2020-10-07 ENCOUNTER — Other Ambulatory Visit: Payer: Self-pay | Admitting: Family Medicine

## 2020-10-07 DIAGNOSIS — Z1231 Encounter for screening mammogram for malignant neoplasm of breast: Secondary | ICD-10-CM

## 2020-10-14 ENCOUNTER — Ambulatory Visit
Admission: RE | Admit: 2020-10-14 | Discharge: 2020-10-14 | Disposition: A | Discharge status: Home / Self Care | Payer: Medicare Other | Source: Ambulatory Visit | Attending: Family Medicine | Admitting: Family Medicine

## 2020-10-14 ENCOUNTER — Other Ambulatory Visit: Payer: Self-pay

## 2020-10-14 DIAGNOSIS — Z1231 Encounter for screening mammogram for malignant neoplasm of breast: Secondary | ICD-10-CM | POA: Diagnosis present

## 2020-10-18 ENCOUNTER — Other Ambulatory Visit: Payer: Self-pay | Admitting: Family Medicine

## 2020-10-18 DIAGNOSIS — R921 Mammographic calcification found on diagnostic imaging of breast: Secondary | ICD-10-CM

## 2020-10-18 DIAGNOSIS — R928 Other abnormal and inconclusive findings on diagnostic imaging of breast: Secondary | ICD-10-CM

## 2020-10-20 ENCOUNTER — Ambulatory Visit
Admission: RE | Admit: 2020-10-20 | Discharge: 2020-10-20 | Disposition: A | Discharge status: Home / Self Care | Payer: Medicare Other | Source: Ambulatory Visit | Attending: Family Medicine | Admitting: Family Medicine

## 2020-10-20 ENCOUNTER — Other Ambulatory Visit: Payer: Self-pay

## 2020-10-20 DIAGNOSIS — R928 Other abnormal and inconclusive findings on diagnostic imaging of breast: Secondary | ICD-10-CM

## 2020-10-20 DIAGNOSIS — R921 Mammographic calcification found on diagnostic imaging of breast: Secondary | ICD-10-CM

## 2020-10-22 ENCOUNTER — Other Ambulatory Visit: Payer: Self-pay | Admitting: Family Medicine

## 2020-10-22 DIAGNOSIS — R921 Mammographic calcification found on diagnostic imaging of breast: Secondary | ICD-10-CM

## 2023-05-22 ENCOUNTER — Encounter: Payer: Self-pay | Admitting: Family Medicine

## 2023-05-22 ENCOUNTER — Other Ambulatory Visit: Payer: Self-pay | Admitting: Family Medicine

## 2023-05-22 DIAGNOSIS — Z1231 Encounter for screening mammogram for malignant neoplasm of breast: Secondary | ICD-10-CM

## 2023-05-23 ENCOUNTER — Other Ambulatory Visit: Payer: Self-pay | Admitting: Family Medicine

## 2023-05-23 DIAGNOSIS — R921 Mammographic calcification found on diagnostic imaging of breast: Secondary | ICD-10-CM

## 2023-07-03 ENCOUNTER — Other Ambulatory Visit: Payer: Medicare Other

## 2023-07-03 ENCOUNTER — Ambulatory Visit
Admission: RE | Admit: 2023-07-03 | Discharge: 2023-07-03 | Disposition: A | Discharge status: Home / Self Care | Payer: Medicare Other | Source: Ambulatory Visit | Attending: Family Medicine | Admitting: Family Medicine

## 2023-07-03 DIAGNOSIS — R921 Mammographic calcification found on diagnostic imaging of breast: Secondary | ICD-10-CM | POA: Insufficient documentation

## 2023-12-13 LAB — COLOGUARD: COLOGUARD: POSITIVE — AB
